# Patient Record
Sex: Male | Born: 1965 | Race: White | Hispanic: No | Marital: Married | Smoking: Never smoker
Health system: Southern US, Community
[De-identification: ages and names within clinical notes are randomized; demographics above are authoritative.]

## PROBLEM LIST (undated history)

## (undated) DIAGNOSIS — G4730 Sleep apnea, unspecified: Secondary | ICD-10-CM

## (undated) DIAGNOSIS — I10 Essential (primary) hypertension: Secondary | ICD-10-CM

## (undated) DIAGNOSIS — E785 Hyperlipidemia, unspecified: Secondary | ICD-10-CM

## (undated) DIAGNOSIS — M549 Dorsalgia, unspecified: Secondary | ICD-10-CM

## (undated) DIAGNOSIS — I219 Acute myocardial infarction, unspecified: Secondary | ICD-10-CM

## (undated) HISTORY — DX: Sleep apnea, unspecified: G47.30

## (undated) HISTORY — DX: Morbid (severe) obesity due to excess calories: E66.01

## (undated) HISTORY — PX: CARDIAC CATHETERIZATION: SHX172

## (undated) HISTORY — DX: Acute myocardial infarction, unspecified: I21.9

## (undated) HISTORY — DX: Essential (primary) hypertension: I10

## (undated) HISTORY — DX: Dorsalgia, unspecified: M54.9

## (undated) HISTORY — DX: Hyperlipidemia, unspecified: E78.5

---

## 1999-06-01 ENCOUNTER — Emergency Department (HOSPITAL_COMMUNITY): Admission: EM | Admit: 1999-06-01 | Discharge: 1999-06-01 | Payer: Self-pay | Admitting: Emergency Medicine

## 2000-07-27 ENCOUNTER — Encounter: Admission: RE | Admit: 2000-07-27 | Discharge: 2000-08-11 | Payer: Self-pay | Admitting: Occupational Medicine

## 2009-05-23 ENCOUNTER — Ambulatory Visit (HOSPITAL_COMMUNITY): Admission: RE | Admit: 2009-05-23 | Discharge: 2009-05-23 | Payer: Self-pay | Admitting: Surgery

## 2009-05-28 ENCOUNTER — Ambulatory Visit (HOSPITAL_COMMUNITY): Admission: RE | Admit: 2009-05-28 | Discharge: 2009-05-28 | Payer: Self-pay | Admitting: Surgery

## 2009-06-13 ENCOUNTER — Encounter: Admission: RE | Admit: 2009-06-13 | Discharge: 2009-06-13 | Payer: Self-pay | Admitting: Surgery

## 2010-05-02 ENCOUNTER — Encounter: Admission: RE | Admit: 2010-05-02 | Discharge: 2010-07-31 | Payer: Self-pay | Admitting: Surgery

## 2010-05-20 IMAGING — RF DG UGI W/O KUB
7 series · 7 of 7 positions shown · non-contrast
Comparison: None

CLINICAL DATA: Bariatric screening.

UPPER GI SERIES WITHOUT KUB
TECHNIQUE: Routine upper GI series was performed with thin barium.
Fluoroscopy Time: 1.1 minutes

[Series 1: run · 1 of 1 slices shown (1 of 7)]
[im 1/1]
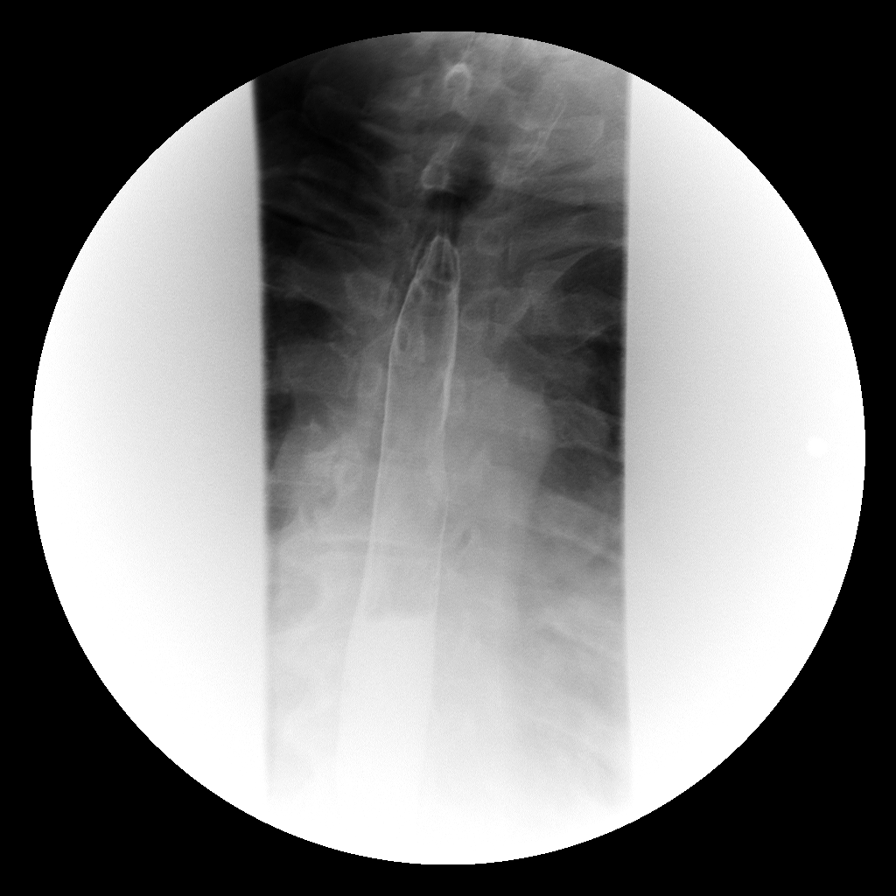

[Series 2: run · 1 of 1 slices shown (2 of 7)]
[im 1/1]
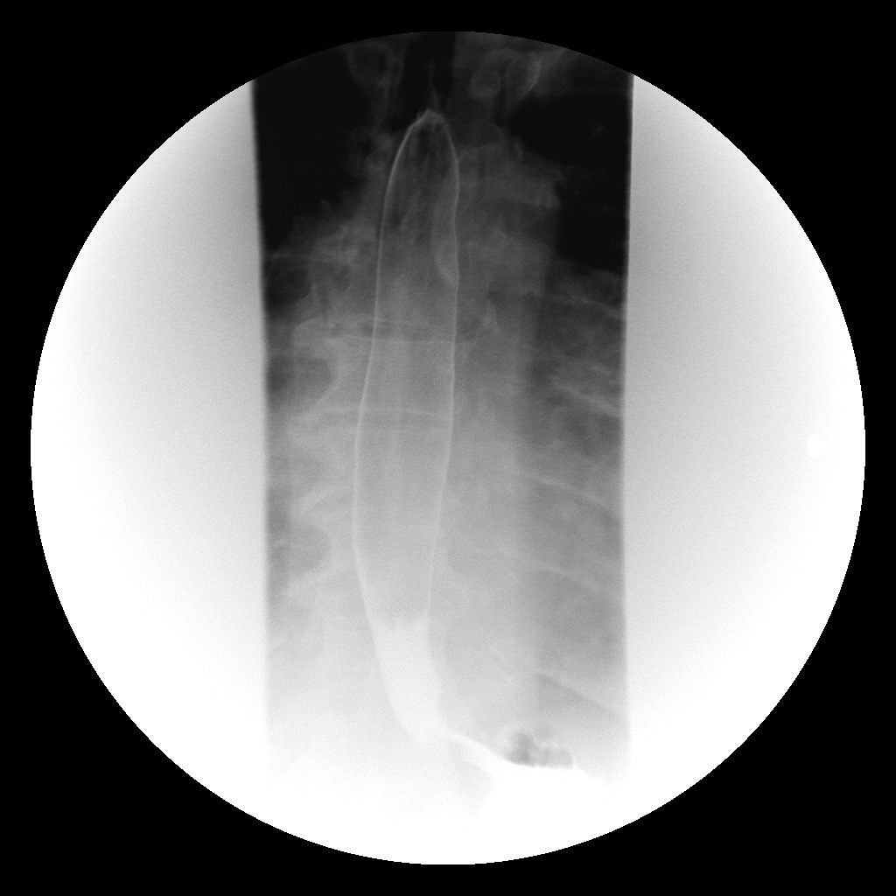

[Series 3: run · 1 of 1 slices shown (3 of 7)]
[im 1/1]
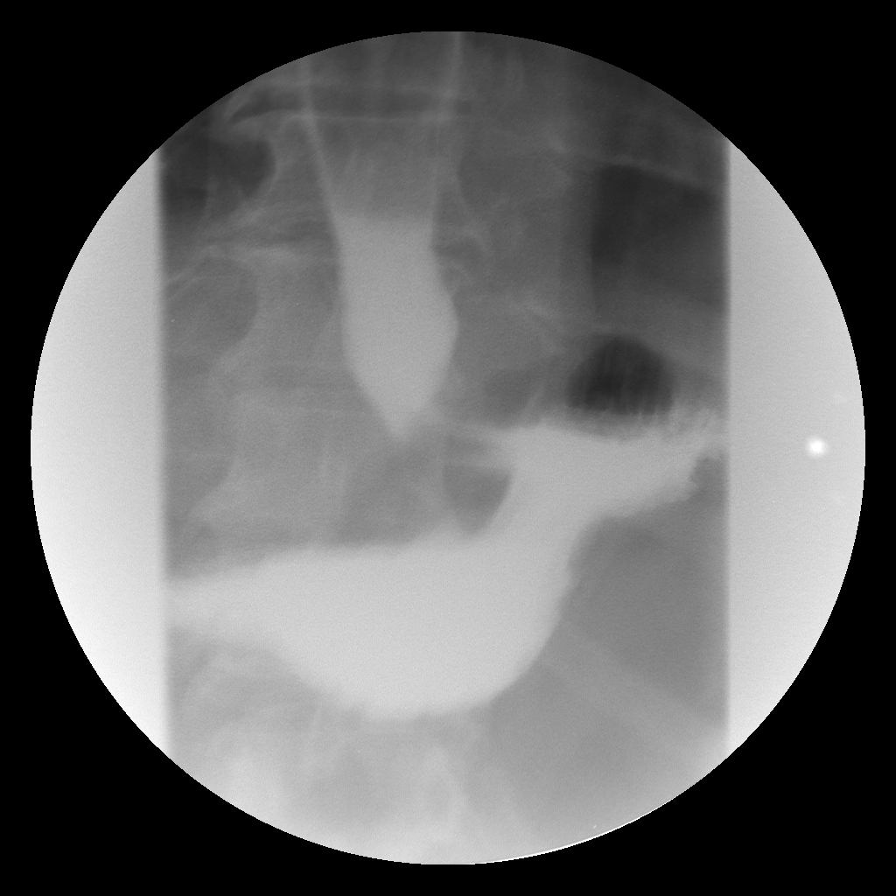

[Series 4: run · 1 of 1 slices shown (4 of 7)]
[im 1/1]
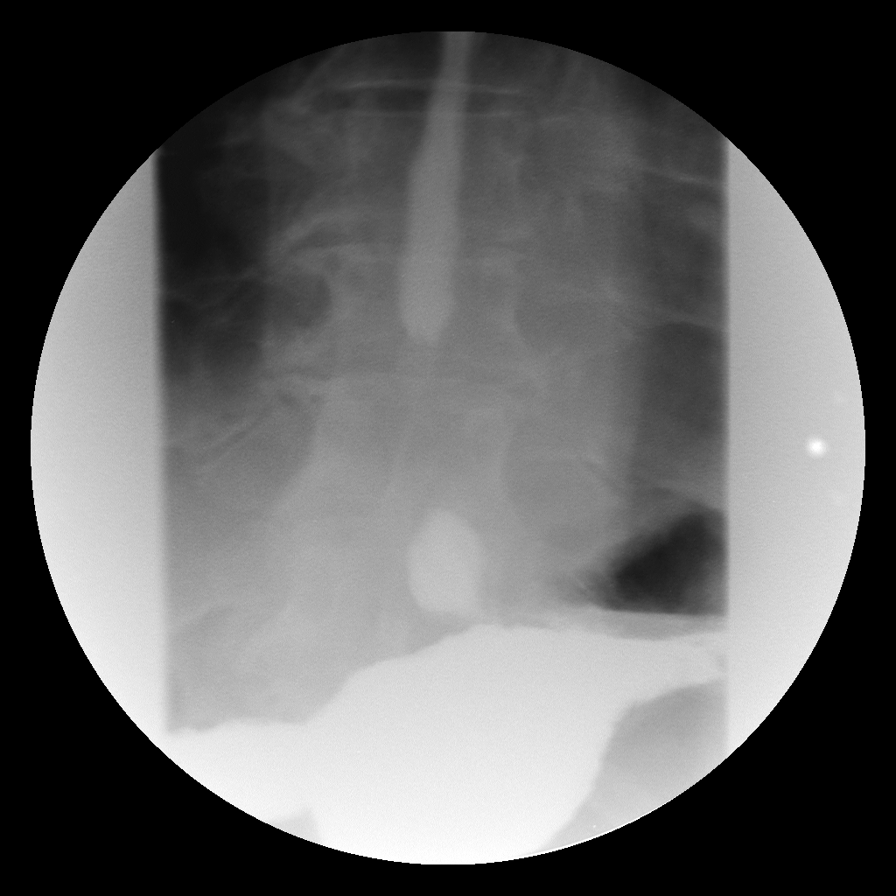

[Series 5: run · 1 of 1 slices shown (5 of 7)]
[im 1/1]
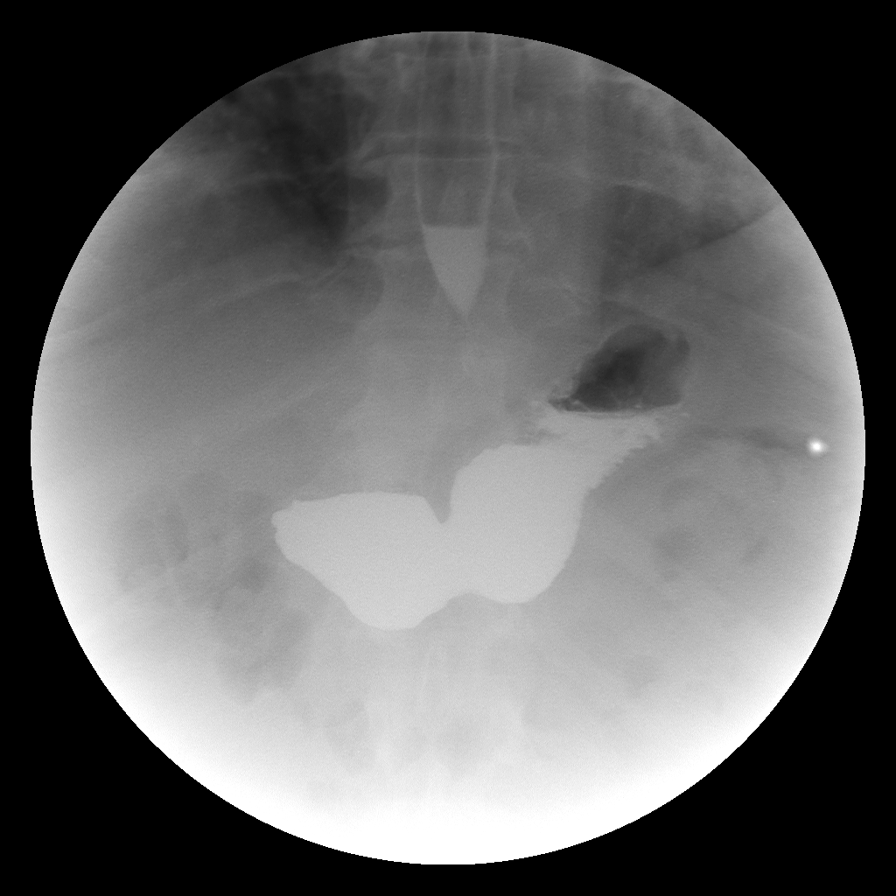

[Series 6: run · 1 of 1 slices shown (6 of 7)]
[im 1/1]
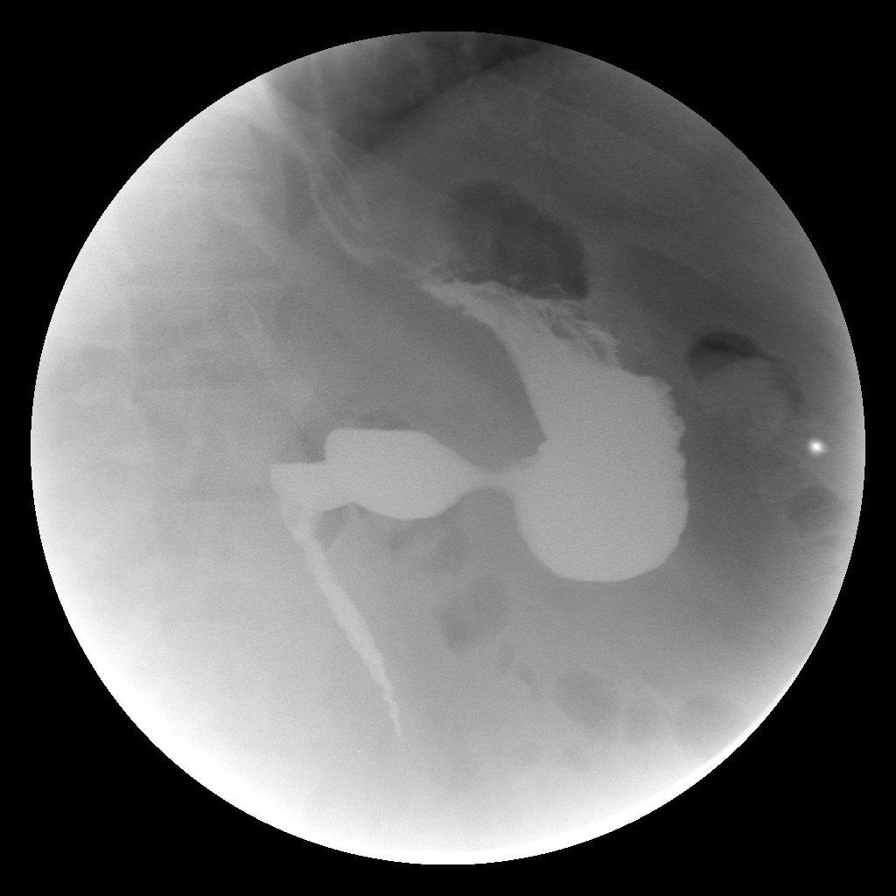

[Series 7: run · 1 of 1 slices shown (7 of 7)]
[im 1/1]
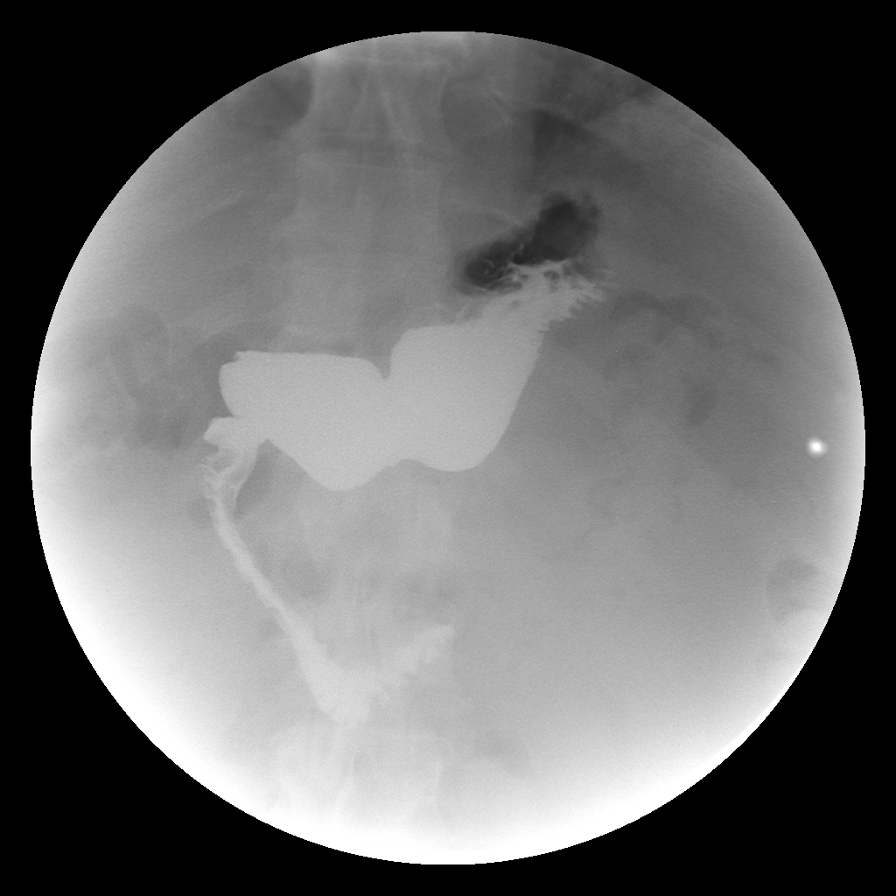

[7 of 7 positions shown; findings below may reference images not displayed]

FINDINGS: A limited upper GI was performed in the standing
position, due to body habitus.  Esophagus, stomach and duodenal C-
loop are unremarkable.
IMPRESSION: Limited exam performed prior to bariatric screening.  No acute
findings.

## 2010-05-21 ENCOUNTER — Ambulatory Visit (HOSPITAL_COMMUNITY): Admission: RE | Admit: 2010-05-21 | Discharge: 2010-05-22 | Payer: Self-pay | Admitting: Surgery

## 2010-05-21 HISTORY — PX: LAPAROSCOPIC GASTRIC BANDING: SHX1100

## 2010-08-01 ENCOUNTER — Encounter
Admission: RE | Admit: 2010-08-01 | Discharge: 2010-09-12 | Payer: Self-pay | Source: Home / Self Care | Admitting: Surgery

## 2010-10-02 ENCOUNTER — Encounter
Admission: RE | Admit: 2010-10-02 | Discharge: 2010-10-02 | Payer: Self-pay | Source: Home / Self Care | Attending: Surgery | Admitting: Surgery

## 2011-01-01 ENCOUNTER — Encounter
Admission: RE | Admit: 2011-01-01 | Discharge: 2011-01-14 | Payer: Self-pay | Source: Home / Self Care | Attending: Surgery | Admitting: Surgery

## 2011-03-03 LAB — COMPREHENSIVE METABOLIC PANEL
Albumin: 4.3 g/dL (ref 3.5–5.2)
CO2: 29 mEq/L (ref 19–32)
Calcium: 9.3 mg/dL (ref 8.4–10.5)
GFR calc non Af Amer: >60 mL/min (ref >60)
Glucose, Bld: 85 mg/dL (ref 70–99)
Potassium: 4.9 mEq/L (ref 3.5–5.1)
Sodium: 136 mEq/L (ref 135–145)
Total Protein: 7.1 g/dL (ref 6.0–8.3)

## 2011-03-03 LAB — DIFFERENTIAL
Basophils Absolute: 0 10*3/uL (ref 0.0–0.1)
Eosinophils Absolute: 0 10*3/uL (ref 0.0–0.7)
Eosinophils Absolute: 0 10*3/uL (ref 0.0–0.7)
Eosinophils Relative: 0 % (ref 0–5)
Lymphocytes Relative: 23 % (ref 12–46)
Lymphs Abs: 1.6 10*3/uL (ref 0.7–4.0)
Lymphs Abs: 1.7 10*3/uL (ref 0.7–4.0)
Monocytes Absolute: 0.6 10*3/uL (ref 0.1–1.0)
Monocytes Absolute: 0.9 10*3/uL (ref 0.1–1.0)
Monocytes Relative: 9 % (ref 3–12)
Neutro Abs: 9.6 10*3/uL — ABNORMAL HIGH (ref 1.7–7.7)
Neutrophils Relative %: 67 % (ref 43–77)

## 2011-03-03 LAB — HEMOGLOBIN AND HEMATOCRIT, BLOOD
HCT: 46.4 % (ref 39.0–52.0)
Hemoglobin: 15.6 g/dL (ref 13.0–17.0)

## 2011-03-03 LAB — CBC
Hemoglobin: 14.8 g/dL (ref 13.0–17.0)
Hemoglobin: 15.9 g/dL (ref 13.0–17.0)
MCHC: 33.9 g/dL (ref 30.0–36.0)
MCHC: 33.9 g/dL (ref 30.0–36.0)
Platelets: 227 10*3/uL (ref 150–400)

## 2011-03-03 LAB — CARDIAC PANEL(CRET KIN+CKTOT+MB+TROPI)
CK, MB: 3.6 ng/mL (ref 0.3–4.0)
Relative Index: 1.2 (ref 0.0–2.5)
Total CK: 308 U/L — ABNORMAL HIGH (ref 7–232)

## 2011-05-18 IMAGING — CR DG CHEST 1V PORT
1 series · 1 of 1 positions shown · non-contrast
Comparison: Chest x-ray of 05/23/2009

CLINICAL DATA: Status post lap banding, chest pain

PORTABLE CHEST - 1 VIEW

[view not recorded]
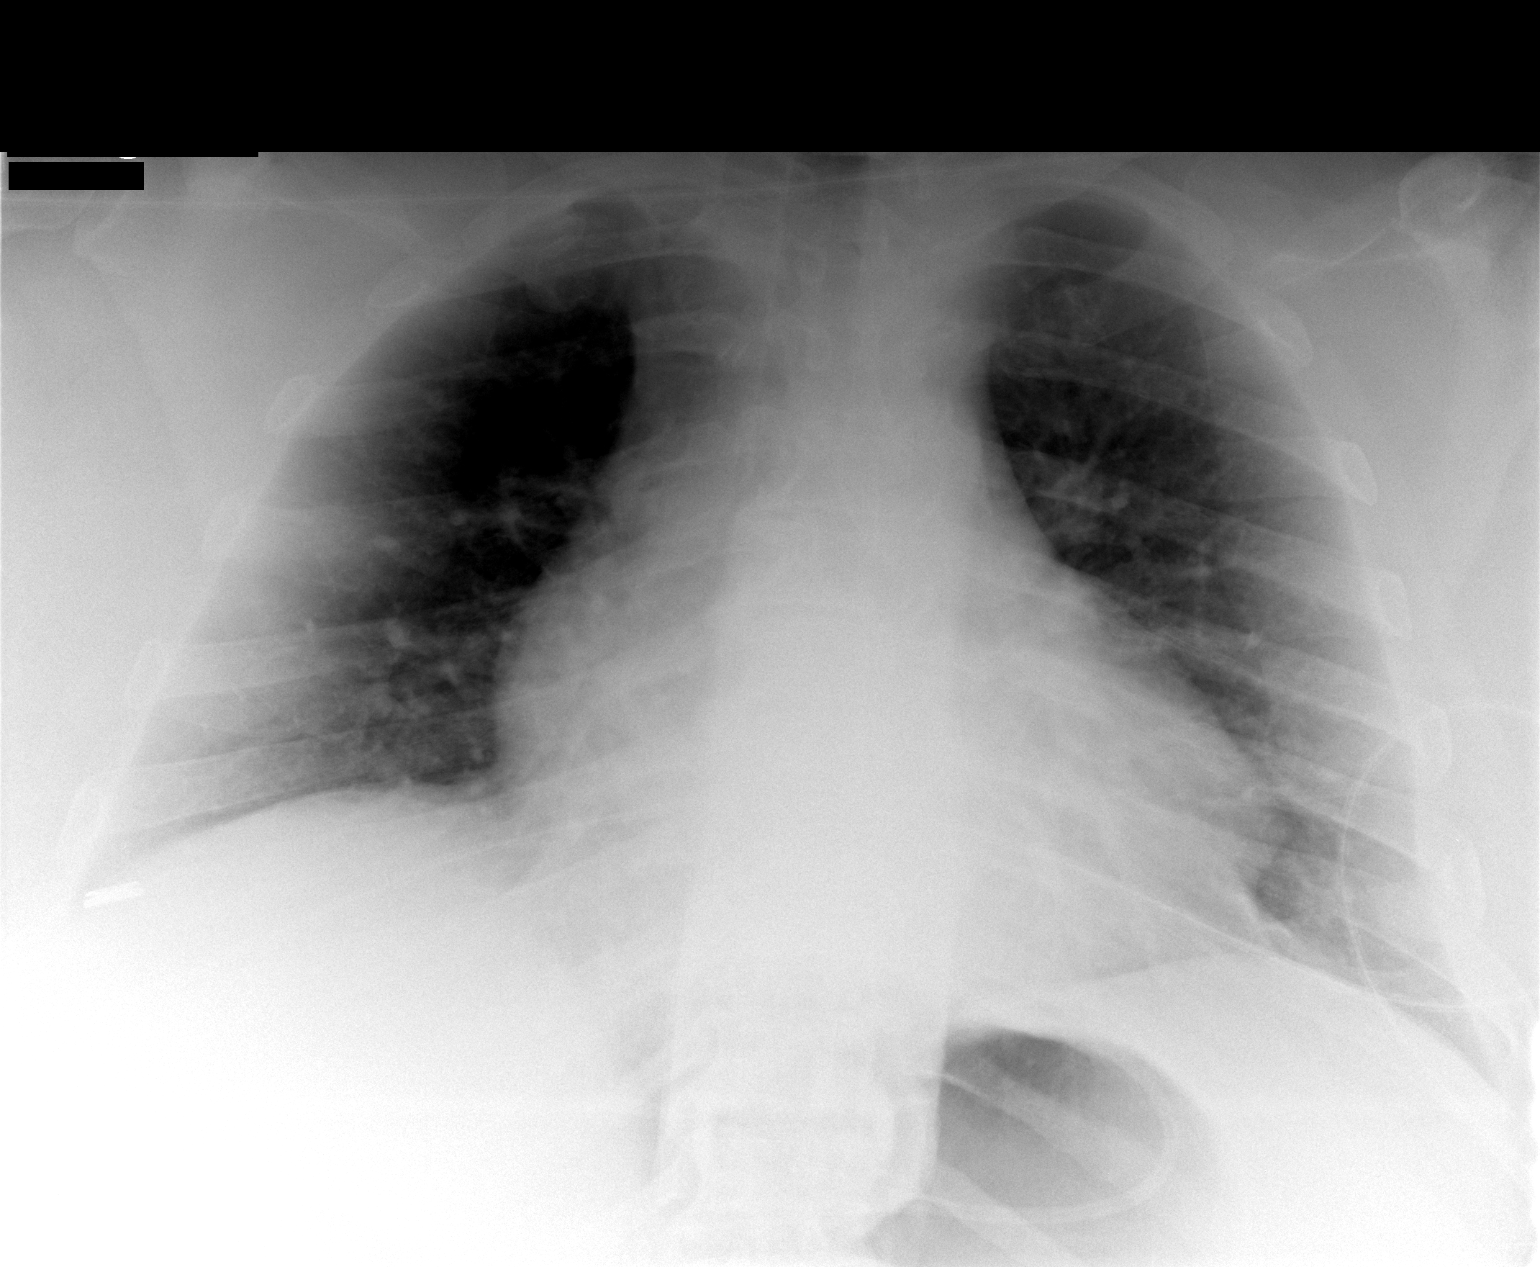

[1 of 1 positions shown; findings below may reference images not displayed]

FINDINGS: The lungs are clear.  Cardiomegaly is stable.  Lap band
is faintly visualized just below the region of the gastroesophageal
junction and there is some gas within the gastric fundus.
IMPRESSION: No active lung disease.  Stable cardiomegaly.

## 2011-05-19 IMAGING — CR DG UGI W/ GASTROGRAFIN
2 series · 2 of 2 positions shown · IV contrast (agent unspecified)
Comparison: 05/23/2009

CLINICAL DATA: Morbid obesity.  Gastric banding.

WATER SOLUBLE UPPER GI SERIES
TECHNIQUE: Single-column upper GI series was performed using water
soluble contrast.
Fluoroscopy Time: 0.7-minute
Contrast: 20 ml Wmnipaque-SKK orally.

[view not recorded (1 of 2)]
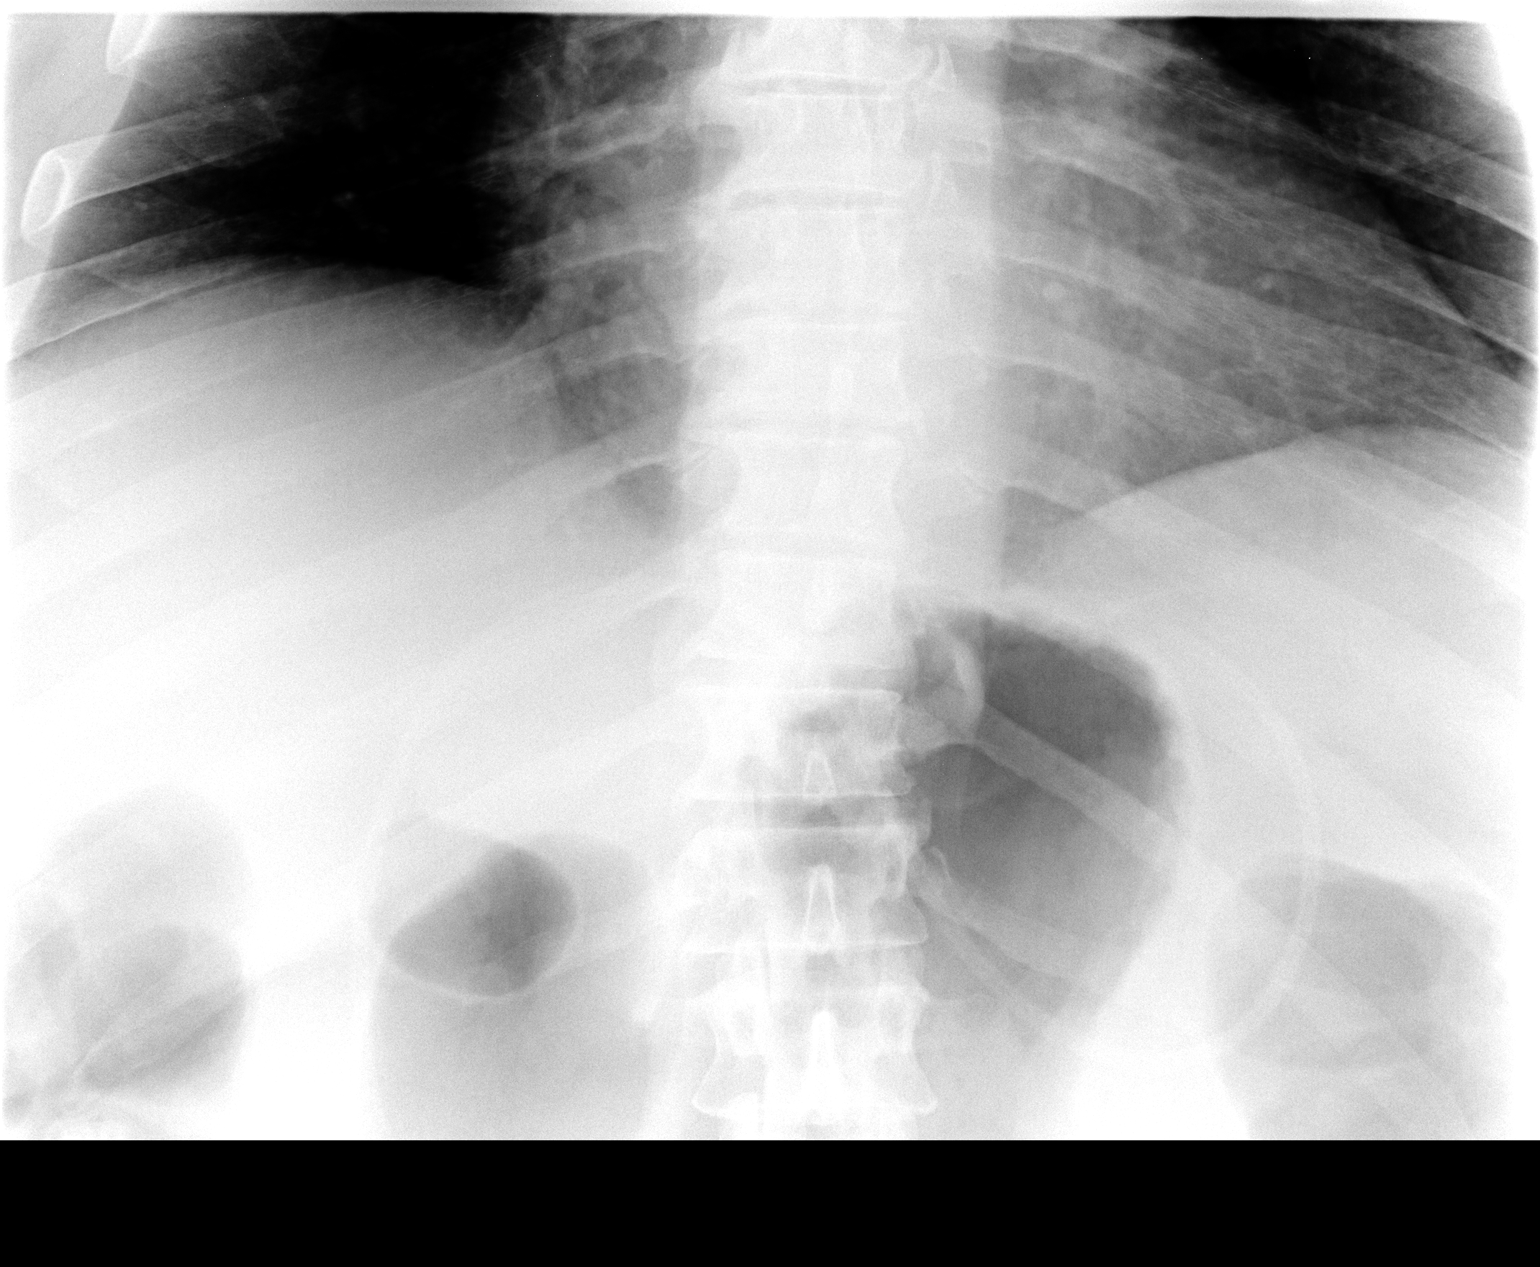

[view not recorded (2 of 2)]
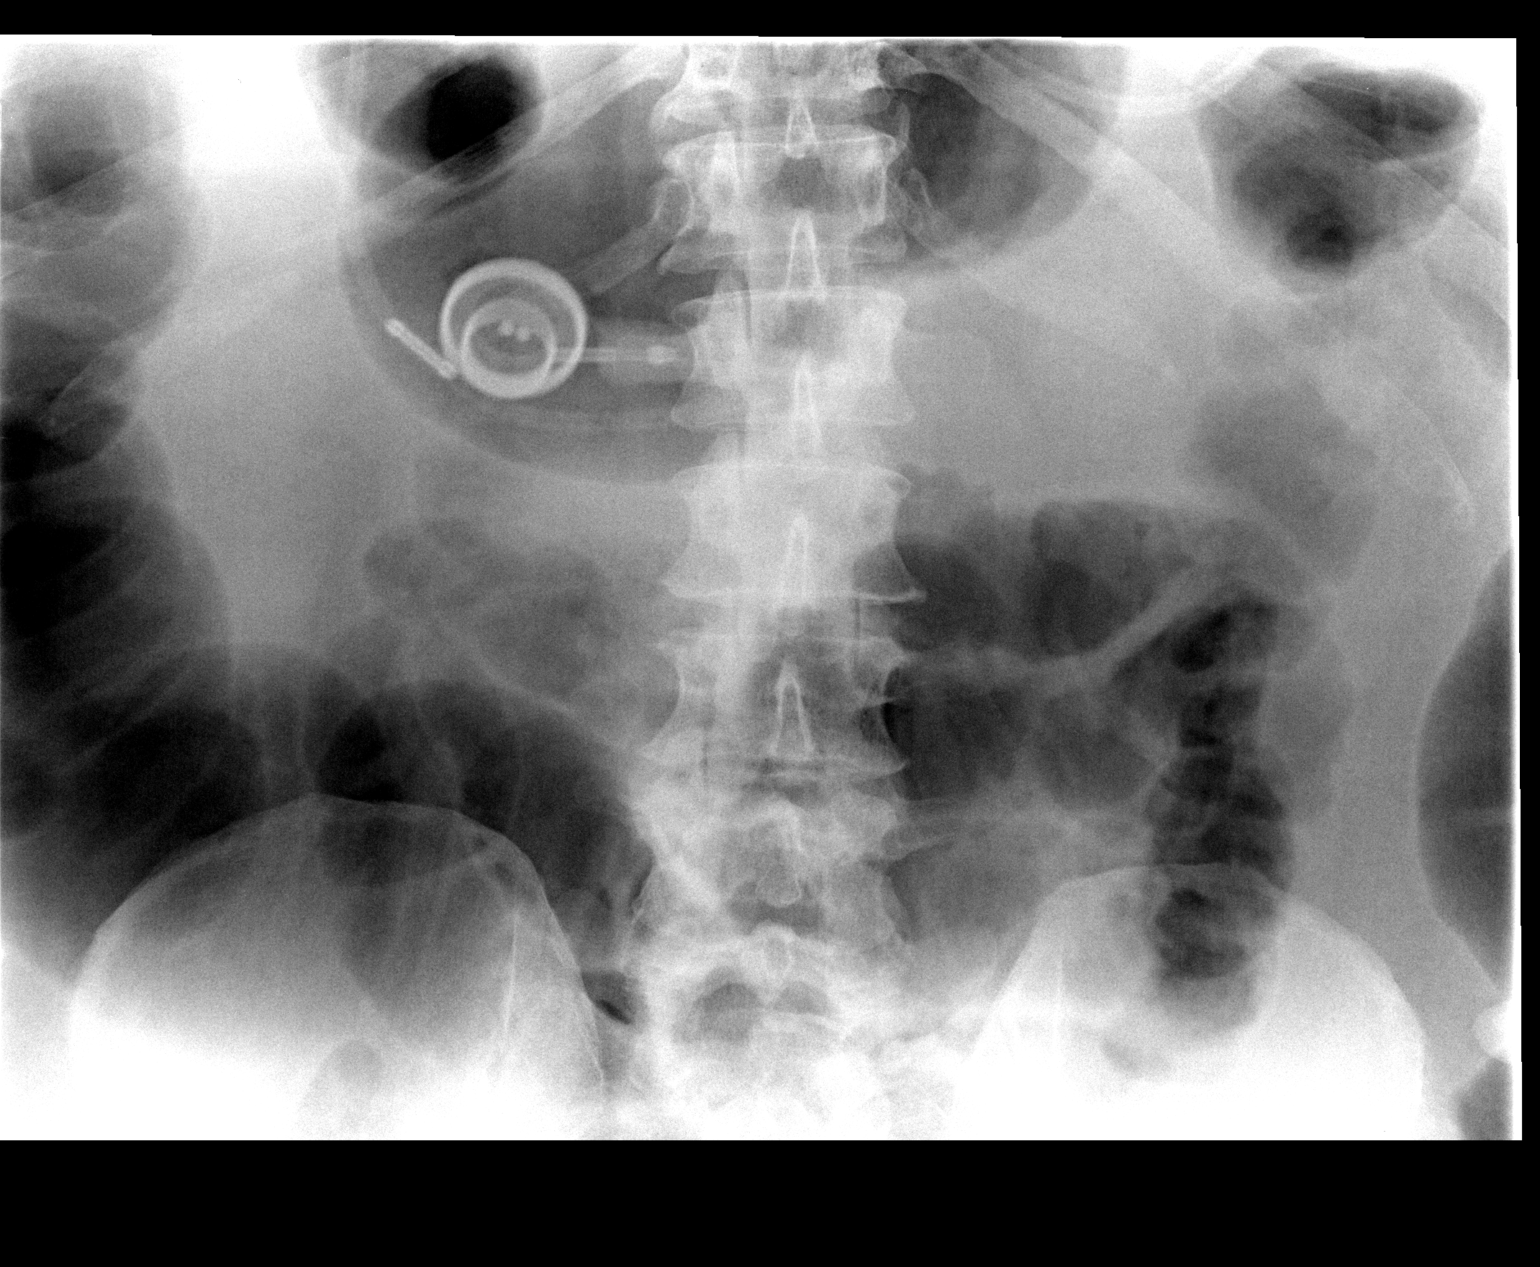

[2 of 2 positions shown; findings below may reference images not displayed]

FINDINGS: The study is limited by morbid obesity.

Preliminary image reveals a gastric band in the 8 o'clock 2 o'clock
position.  Tubing is difficult to see however appears to be
continuous.  The port is in the right mid abdomen.  There is a mild
ileus with gas in the colon.

The patient swallowed Omnipaque contrast.  The patient was
standing for the study.  Contrast readily enters the stomach
without obstruction due to the gastric band.  Gastric band appears
to be in the appropriate position.  The stomach is evaluated in a
limited fashion due to 20 ml of contrast used.  The stomach empties
into the duodenum.

No evidence of leak or obstruction.
IMPRESSION: Satisfactory gastric banding without obstruction or leak.

I called the report to [place] , Dr. Yohisa nurse, on 05/22/2010 at
6333 hours.

## 2011-08-07 ENCOUNTER — Encounter: Payer: Managed Care, Other (non HMO) | Attending: Surgery | Admitting: *Deleted

## 2011-08-07 ENCOUNTER — Encounter: Payer: Self-pay | Admitting: *Deleted

## 2011-08-07 DIAGNOSIS — Z9884 Bariatric surgery status: Secondary | ICD-10-CM | POA: Insufficient documentation

## 2011-08-07 DIAGNOSIS — Z713 Dietary counseling and surveillance: Secondary | ICD-10-CM | POA: Insufficient documentation

## 2011-08-07 DIAGNOSIS — Z09 Encounter for follow-up examination after completed treatment for conditions other than malignant neoplasm: Secondary | ICD-10-CM | POA: Insufficient documentation

## 2011-08-07 NOTE — Patient Instructions (Signed)
Goals:  Follow Phase 3B: High Protein + Non-Starchy Vegetables  Eat 3-6 small meals/snacks, every 3-5 hrs  Increase lean protein foods to meet 80-100g goal  Increase fluid intake to 64oz +  Add 15 grams of carbohydrate (fruit, whole grain, starchy vegetable) with meals  Avoid drinking 15 minutes before, during and 30 minutes after eating  Aim for >30 min of physical activity daily 

## 2011-08-07 NOTE — Progress Notes (Signed)
  Follow-up visit: 14 Months Post-Operative LAGB Surgery  Medical Nutrition Therapy:  Appt start time: 1130 end time:  1200.  Assessment:  Primary concerns today: post-operative bariatric surgery nutrition management. Pt reports today that he has been trying to make an appointment for a LAGB fill with Dr. Daphine Deutscher since June. He states that each time he calls [facility] he is put on a call list to see him. He would rather see Dr. Daphine Deutscher over the PA. He feels he is in a desperate need for a fill as portions are becoming larger and weight loss has slowed.  Weight today: 296.2 lbs Weight change: 3.9 lbs since 12/2010 Total weight lost: 129.3 lbs (133.8 lbs total on pt's scales) BMI: 40.3% Weight goal: 225-250 lbs % Weight goal met: 70%  24-hr recall:  B (8-9 AM):1 cup Bran cereal w/ protein drink (uses this as milk)  L (11 AM-3 PM): [employer] Grilled chicken (4 pc) Snk (4 PM): Protein shake  D (8 PM-1 AM): 2-egg Fiesta omelet (egg, veggies), 2 pc toast, 1 cup grits Snk (Variable times): Protein shake or Cereal bar *Pt reports consuming 3 protein shakes per day  Fluid intake: water, protein shakes = >64 oz Estimated total protein intake: 100g+ protein  Medications: Pt reports that he has been taken off so many things he can't remember his meds Supplementation: Taking 50% of the time  Using straws: No Drinking while eating: No Hair loss: No Carbonated beverages: No N/V/D/C: Constipation, uses bran cereal for this Last Lap-Band fill: No recent fill; pt is very upset about this. 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  Handouts given during visit include:  Food label reading guideline handout   Nutritional Diagnosis:  [place]-3.3 Overweight/obesity As related to recent bariatric surgery.  As  evidenced by attempt at continued weight loss through following bariatric surgery dietary guidelines.    Intervention:    Follow Phase 3B: High Protein + Non-Starchy Vegetables  Eat 3-6 small meals/snacks, every 3-5 hrs  Increase lean protein foods to meet 80-100g goal  Increase fluid intake to 64oz +  Add 15-20 grams of carbohydrate (fruit, whole grain, starchy vegetable) with meals  Avoid drinking 15 minutes before, during and 30 minutes after eating  Aim for >30 min of physical activity daily  Monitoring/Evaluation:  Dietary intake, exercise, lap band fills, and body weight. Follow up PRN.

## 2011-08-26 ENCOUNTER — Encounter (INDEPENDENT_AMBULATORY_CARE_PROVIDER_SITE_OTHER): Payer: Self-pay | Admitting: General Surgery

## 2011-08-27 ENCOUNTER — Encounter (INDEPENDENT_AMBULATORY_CARE_PROVIDER_SITE_OTHER): Payer: Self-pay | Admitting: Surgery

## 2011-08-27 ENCOUNTER — Ambulatory Visit (INDEPENDENT_AMBULATORY_CARE_PROVIDER_SITE_OTHER): Payer: Managed Care, Other (non HMO) | Admitting: Surgery

## 2011-08-27 DIAGNOSIS — Z9884 Bariatric surgery status: Secondary | ICD-10-CM

## 2011-08-27 DIAGNOSIS — Z4651 Encounter for fitting and adjustment of gastric lap band: Secondary | ICD-10-CM

## 2011-08-27 NOTE — Patient Instructions (Signed)

## 2011-08-27 NOTE — Progress Notes (Signed)
Mr. Dolata is down to 44 and has lost 136 pounds since his LAP-BAND APL system.  He is 15 months postop and has lost 137 pounds. This is 51% excess weight loss and his BMI today is down to 41. His preop BMI was 60. We still have a ways to go and I went ahead and added fluid has been today. His comorbidities included fatigue, heart disease, high blood pressure, joint pain, arthritis, high cholesterol. His primary care physician is Dr. Morton Stall.    Today I added one half cc to his band. If this suffices I can probably see him 6 weeks to 3 months out. However I've had him make an appointment for 3 weeks so that if he's not as tight as we wanted to be and we can add more. I would include that as part of today's chart and not charge him anymore further adjustments to get him as firmly in the "Green Zone".

## 2011-10-01 ENCOUNTER — Encounter (INDEPENDENT_AMBULATORY_CARE_PROVIDER_SITE_OTHER): Payer: Self-pay | Admitting: Surgery

## 2011-10-01 ENCOUNTER — Ambulatory Visit (INDEPENDENT_AMBULATORY_CARE_PROVIDER_SITE_OTHER): Payer: Managed Care, Other (non HMO) | Admitting: Surgery

## 2011-10-01 VITALS — BP 124/78 | HR 68 | Temp 98.4°F | Resp 16 | Ht 71.0 in | Wt 300.2 lb

## 2011-10-01 DIAGNOSIS — Z9884 Bariatric surgery status: Secondary | ICD-10-CM

## 2011-10-01 DIAGNOSIS — Z4651 Encounter for fitting and adjustment of gastric lap band: Secondary | ICD-10-CM

## 2011-10-01 NOTE — Progress Notes (Signed)
Justin Marquez comes in today for a band fill. His weight has plateaued in between 290 and 300 pounds. He started out of 433. I went ahead and added 0.6 cc to his band today. He can drink fluids fine. I will see him back in 6 weeks.

## 2011-11-21 ENCOUNTER — Ambulatory Visit (INDEPENDENT_AMBULATORY_CARE_PROVIDER_SITE_OTHER): Payer: Managed Care, Other (non HMO) | Admitting: Surgery

## 2011-11-21 ENCOUNTER — Encounter (INDEPENDENT_AMBULATORY_CARE_PROVIDER_SITE_OTHER): Payer: Self-pay | Admitting: Surgery

## 2011-11-21 VITALS — BP 132/88 | HR 68 | Temp 97.1°F | Resp 18 | Ht 71.0 in | Wt 304.1 lb

## 2011-11-21 DIAGNOSIS — Z9884 Bariatric surgery status: Secondary | ICD-10-CM

## 2011-11-21 DIAGNOSIS — Z4651 Encounter for fitting and adjustment of gastric lap band: Secondary | ICD-10-CM

## 2011-11-21 NOTE — Progress Notes (Signed)
Justin Marquez comes in today after spending most and not an option. He obtained a low white but not that much but he said Thanksgiving was really tough for carb consumption.  Today I added 0.5 cc was band. I warned him about eating too many carbs and we'll see him back in 2 months.

## 2011-11-21 NOTE — Patient Instructions (Signed)

## 2012-01-30 ENCOUNTER — Ambulatory Visit (INDEPENDENT_AMBULATORY_CARE_PROVIDER_SITE_OTHER): Payer: Managed Care, Other (non HMO) | Admitting: Surgery

## 2012-01-30 ENCOUNTER — Encounter (INDEPENDENT_AMBULATORY_CARE_PROVIDER_SITE_OTHER): Payer: Self-pay | Admitting: Surgery

## 2012-01-30 DIAGNOSIS — Z9884 Bariatric surgery status: Secondary | ICD-10-CM | POA: Insufficient documentation

## 2012-01-30 DIAGNOSIS — E669 Obesity, unspecified: Secondary | ICD-10-CM | POA: Insufficient documentation

## 2012-01-30 NOTE — Progress Notes (Signed)
Mr. Justin Marquez is having some nasal congestion that he says is not related to allergies but it is interfered with his using his CPAP machine. His weight is down to 300.6 but he said he really hasn't been able to exercise or work at weight loss. He feels he is optimally restricted at this time. Therefore I did not do a fill on him. We will instead see him back in 2 months.

## 2012-03-26 ENCOUNTER — Encounter (INDEPENDENT_AMBULATORY_CARE_PROVIDER_SITE_OTHER): Payer: Managed Care, Other (non HMO) | Admitting: Surgery

## 2013-01-26 ENCOUNTER — Encounter: Payer: Managed Care, Other (non HMO) | Attending: Surgery | Admitting: *Deleted

## 2013-01-26 ENCOUNTER — Encounter: Payer: Self-pay | Admitting: *Deleted

## 2013-01-26 ENCOUNTER — Ambulatory Visit (INDEPENDENT_AMBULATORY_CARE_PROVIDER_SITE_OTHER): Payer: Managed Care, Other (non HMO) | Admitting: Surgery

## 2013-01-26 ENCOUNTER — Encounter (INDEPENDENT_AMBULATORY_CARE_PROVIDER_SITE_OTHER): Payer: Managed Care, Other (non HMO) | Admitting: Surgery

## 2013-01-26 ENCOUNTER — Encounter (INDEPENDENT_AMBULATORY_CARE_PROVIDER_SITE_OTHER): Payer: Self-pay | Admitting: Surgery

## 2013-01-26 VITALS — BP 152/76 | HR 64 | Temp 97.6°F | Resp 12 | Ht 71.0 in | Wt 305.8 lb

## 2013-01-26 VITALS — Ht 72.0 in | Wt 304.5 lb

## 2013-01-26 DIAGNOSIS — Z9884 Bariatric surgery status: Secondary | ICD-10-CM

## 2013-01-26 DIAGNOSIS — Z09 Encounter for follow-up examination after completed treatment for conditions other than malignant neoplasm: Secondary | ICD-10-CM | POA: Insufficient documentation

## 2013-01-26 DIAGNOSIS — E669 Obesity, unspecified: Secondary | ICD-10-CM

## 2013-01-26 DIAGNOSIS — Z713 Dietary counseling and surveillance: Secondary | ICD-10-CM | POA: Insufficient documentation

## 2013-01-26 NOTE — Patient Instructions (Signed)

## 2013-01-26 NOTE — Patient Instructions (Addendum)
Goals:  Follow Bariatric Surgery Specialized Post-Op Diet  Limit carbohydrate intake to 15-20 grams per meal and 15 grams per snack  Measure portions and utilize www.calorieking.com for food label information   Avoid drinking 15 minutes before, during and 30 minutes after eating  Aim for >30 min of physical activity daily  It may help to track your intake for a few weeks to identify problem areas  Start weight: 411.5 lbs (06/13/09) Weight today: 304.5 lbs  Total weight lost: 107.0 lbs  Goal weight: 225-250 lbs  % goal met: 57-66%   TANITA BODY COMP RESULTS   01/26/13   BMI (kg/m^2)  41.3   Fat Mass (lbs)  106.5   Fat Free Mass (lbs)  198.0   Total Body Water (lbs)  45.0    Food Recall Analysis (approximate):  170g CHO 110g Protein 75g Fat

## 2013-01-26 NOTE — Progress Notes (Signed)
Mauricio Dahlen Wilden Body mass index is 42.67 kg/(m^2).  Having regurgitation:  no  Nocturnal reflux?  no  Amount of fill  0.5  Wants to get back on track and just saw the nutritionist.  Will see back in 3 months if he needs to be seen.

## 2013-01-26 NOTE — Progress Notes (Signed)
  Follow-up visit:  2.5 Years Post-Operative LAGB Surgery  Medical Nutrition Therapy:  Appt start time:  1130   End time: 1230.  Primary concerns today: Post-operative Bariatric Surgery Nutrition Management. "Justin Marquez" comes today to re-establish care after a 1.5 yr hiatus from [facility]. Reports lowest post-op wt of 289 lbs. Has maintained current wt of 304 lbs for last 4-6 mos. Food recall reveals highly excessive CHO intake when traveling with his job and at home. States he is trying to make better choices when eating out. Discussed reading food labels and using calorieking.com for CHO amounts. Has not had f/u with Dr. Daphine Deutscher since 01/2012. Sees him today for possible fill. No structured exercise noted. 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    Surgery date: 05/21/10  Surgery type: LAGB  Start weight at [facility]: 411.5 (06/13/09) Lowest pt reported post-op wt: 289.0 lbs   Weight today: 304.5 lbs  Weight change: 15.5 lbs (GAIN) Total weight lost: 107.0 lbs   Goal weight: 225-250 lbs  % goal met: 57-66%  TANITA  BODY COMP RESULTS  01/26/13   BMI (kg/m^2) 41.3   Fat Mass (lbs) 106.5   Fat Free Mass (lbs) 198.0   Total Body Water (lbs) 45.0   24-hr recall: B (AM): 2.5 cups bran cereal (80g CHO) w/ Atkins shake and skim milk ("to stay regular") Snk (AM): Apple (med) or 2 oz nuts (peanuts, almonds)  L (PM): Lemon pepper chicken legs, broiled (2-3 ea) OR 1/2 bratwurst and Atkins shake (waits 5-10 min after meal before drinking) Snk (PM): Beef jerky, nuts, apple, or pepperoni  D (PM): English muffin w/ egg whites, ham, spinach, tomato, [place] bacon; 1/2 cup vanilla yogurt w/ blueberries; 1/2 cup fruit (bananas&grapes) Snk (PM): Atkins shake  Food Recall Analysis (approximate):  170g CHO 110g Protein 75g Fat  Fluid intake: 33 oz shakes; 2-3 (17 oz) water bottles, diet green tea (17oz), reg coffee:  80-95 oz Estimated total protein intake: 100-110 g  Medications:  See medication list Supplementation: Unknown  Using straws: Rarely Drinking while eating: No, but does 10 min afterwards Hair loss: No Carbonated beverages: No N/V/D/C: Dry foods cause belching; cannot tolerate chicken breast Last Lap-Band fill:  11/21/11; Seeing Dr. Daphine Deutscher today for possible fill  Recent physical activity:  No structured exercise, though highly active at work.   Progress Towards Goal(s):  In progress.  Handouts given during visit include:  Bariatric Surgery Specialized Post-Op Diet  Low CHO Snack List  Meal Planning Card   Nutritional Diagnosis:  [place]-3.3 Overweight/obesity and [place]-3.4 Unintentional weight gain As related to excessive CHO and fat intake.  As evidenced by 15 lb wt gain s/p LAGB surgery.    Intervention:  Nutrition education.  Monitoring/Evaluation:  Dietary intake, exercise, lap band fills, and body weight. Follow up in 6 weeks.

## 2013-02-17 ENCOUNTER — Encounter (INDEPENDENT_AMBULATORY_CARE_PROVIDER_SITE_OTHER): Payer: Managed Care, Other (non HMO)

## 2013-03-09 ENCOUNTER — Encounter: Payer: Self-pay | Admitting: *Deleted

## 2013-03-09 ENCOUNTER — Encounter: Payer: Managed Care, Other (non HMO) | Attending: Surgery | Admitting: *Deleted

## 2013-03-09 VITALS — Ht 71.0 in | Wt 307.0 lb

## 2013-03-09 DIAGNOSIS — E669 Obesity, unspecified: Secondary | ICD-10-CM

## 2013-03-09 DIAGNOSIS — Z9884 Bariatric surgery status: Secondary | ICD-10-CM | POA: Insufficient documentation

## 2013-03-09 DIAGNOSIS — Z713 Dietary counseling and surveillance: Secondary | ICD-10-CM | POA: Insufficient documentation

## 2013-03-09 DIAGNOSIS — Z09 Encounter for follow-up examination after completed treatment for conditions other than malignant neoplasm: Secondary | ICD-10-CM | POA: Insufficient documentation

## 2013-03-09 NOTE — Progress Notes (Signed)
  Follow-up visit:  2.5 Years Post-Operative LAGB Surgery  Medical Nutrition Therapy:  Appt start time:  1045   End time: 1115.  Primary concerns today: Post-operative Bariatric Surgery Nutrition Management. "Justin Marquez" comes today to re-establish care after a 1.5 yr hiatus from [facility]. Reports severe stress in family regarding situation with his daughter, which caused him to choose high fat/carb foods and subsequent wt gain. Discussed working through current issues and getting back on track.   Surgery date: 05/21/10  Surgery type: LAGB  Start weight at [facility]: 411.5 (06/13/09) Lowest pt reported post-op wt: 289.0 lbs   Weight today: 307.0 lbs  Weight change: 2.5 lbs (GAIN) Total weight lost: 104.5 lbs   Goal weight: 225-250 lbs  % goal met: 57-66%  TANITA  BODY COMP RESULTS  01/26/13 03/09/13   BMI (kg/m^2) 41.3 42.8   Fat Mass (lbs) 106.5 113.0   Fat Free Mass (lbs) 198.0 194.0   Total Body Water (lbs) 145.0 142.0   Fluid intake: 33 oz shakes; 2-3 (17 oz) water bottles, diet green tea (17oz), reg coffee:  80-95 oz Estimated total protein intake: 80-90 g  Medications: See medication list Supplementation:  No  Using straws: Rarely Drinking while eating: No, but does 10 min afterwards Hair loss: No Carbonated beverages: No N/V/D/C: Dry foods cause belching; cannot tolerate chicken breast Last Lap-Band fill:  01/26/13  Recent physical activity:  No structured exercise, though highly active at work.   Progress Towards Goal(s):  In progress.  Nutritional Diagnosis:  [place]-3.3 Overweight/obesity and [place]-3.4 Unintentional weight gain related to excessive CHO and fat intake as evidenced by continued weight gain s/p LAGB surgery.    Intervention:  Nutrition education.  Monitoring/Evaluation:  Dietary intake, exercise, lap band fills, and body weight. Follow up in 6 weeks.

## 2013-03-09 NOTE — Patient Instructions (Signed)
Goals:  Follow Bariatric Surgery Specialized Post-Op Diet  Limit carbohydrate intake to 15-20 grams per meal and 15 grams per snack  Measure portions and utilize www.calorieking.com for food label information   Avoid drinking 15 minutes before, during and 30 minutes after eating  Aim for >30 min of physical activity daily  It may help to track your intake for a few weeks to identify problem areas

## 2013-03-11 ENCOUNTER — Encounter: Payer: Self-pay | Admitting: *Deleted

## 2013-04-28 ENCOUNTER — Encounter (INDEPENDENT_AMBULATORY_CARE_PROVIDER_SITE_OTHER): Payer: Managed Care, Other (non HMO) | Admitting: Surgery

## 2014-11-21 ENCOUNTER — Encounter (HOSPITAL_COMMUNITY): Payer: Self-pay | Admitting: *Deleted

## 2014-11-21 ENCOUNTER — Emergency Department (HOSPITAL_COMMUNITY)
Admission: EM | Admit: 2014-11-21 | Discharge: 2014-11-21 | Disposition: A | Discharge status: Home / Self Care | Payer: Managed Care, Other (non HMO) | Attending: Emergency Medicine | Admitting: Emergency Medicine

## 2014-11-21 DIAGNOSIS — S46111A Strain of muscle, fascia and tendon of long head of biceps, right arm, initial encounter: Secondary | ICD-10-CM | POA: Insufficient documentation

## 2014-11-21 DIAGNOSIS — Z791 Long term (current) use of non-steroidal anti-inflammatories (NSAID): Secondary | ICD-10-CM | POA: Diagnosis not present

## 2014-11-21 DIAGNOSIS — Y9389 Activity, other specified: Secondary | ICD-10-CM | POA: Diagnosis not present

## 2014-11-21 DIAGNOSIS — E780 Pure hypercholesterolemia: Secondary | ICD-10-CM | POA: Insufficient documentation

## 2014-11-21 DIAGNOSIS — Z79899 Other long term (current) drug therapy: Secondary | ICD-10-CM | POA: Diagnosis not present

## 2014-11-21 DIAGNOSIS — Z8669 Personal history of other diseases of the nervous system and sense organs: Secondary | ICD-10-CM | POA: Diagnosis not present

## 2014-11-21 DIAGNOSIS — Z9889 Other specified postprocedural states: Secondary | ICD-10-CM | POA: Diagnosis not present

## 2014-11-21 DIAGNOSIS — S4991XA Unspecified injury of right shoulder and upper arm, initial encounter: Secondary | ICD-10-CM | POA: Diagnosis present

## 2014-11-21 DIAGNOSIS — Z7982 Long term (current) use of aspirin: Secondary | ICD-10-CM | POA: Diagnosis not present

## 2014-11-21 DIAGNOSIS — Y9289 Other specified places as the place of occurrence of the external cause: Secondary | ICD-10-CM | POA: Diagnosis not present

## 2014-11-21 DIAGNOSIS — I10 Essential (primary) hypertension: Secondary | ICD-10-CM | POA: Diagnosis not present

## 2014-11-21 DIAGNOSIS — S46211A Strain of muscle, fascia and tendon of other parts of biceps, right arm, initial encounter: Secondary | ICD-10-CM

## 2014-11-21 DIAGNOSIS — W230XXA Caught, crushed, jammed, or pinched between moving objects, initial encounter: Secondary | ICD-10-CM | POA: Diagnosis not present

## 2014-11-21 DIAGNOSIS — Y998 Other external cause status: Secondary | ICD-10-CM | POA: Insufficient documentation

## 2014-11-21 MED ORDER — NAPROXEN 250 MG PO TABS
500.0000 mg | ORAL_TABLET | Freq: Once | ORAL | Status: AC
  Administered 2014-11-21: 500 mg via ORAL
  Filled 2014-11-21: qty 2

## 2014-11-21 MED ORDER — HYDROCODONE-ACETAMINOPHEN 5-325 MG PO TABS: 2.0000 | ORAL_TABLET | ORAL | Status: AC | PRN

## 2014-11-21 MED ORDER — NAPROXEN 500 MG PO TABS: 500.0000 mg | ORAL_TABLET | Freq: Two times a day (BID) | ORAL | Status: AC

## 2014-11-21 NOTE — ED Provider Notes (Signed)
CSN: [id number]     Arrival date & time 11/21/14  1825 History  This chart was scribed for Vida RollerBrian D Sherrel Ploch, MD by Gwenyth Oberatherine Macek, ED Scribe. This patient was seen in room APA10/APA10 and the patient's care was started at 7:44 PM.   Chief Complaint  Patient presents with  . Arm Injury   The history is provided by the patient. No language interpreter was used.    HPI Comments: Justin Marquez is a 48 y.o. 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 He states that he injured arm when he mistepped between truck and docking area, used his arm to hold onto the truck and heard a ripping sound. He notes decreased motion due to pain as an associated symptom. He denies any other injuries.  Sx were acute in onset,  Constant Worse with trying to straighten the arm No associated numbness / weakness  Past Medical History  Diagnosis Date  . Sleep apnea   . Hyperlipidemia   . Hypertension   . Back pain   . Heart attack   . Heart attack   . Morbid obesity    Past Surgical History  Procedure Laterality Date  . Cardiac catheterization    . Laparoscopic gastric banding  05/21/10   History reviewed. No pertinent family history. History  Substance Use Topics  . Smoking status: Never Smoker   . Smokeless tobacco: Current User    Types: Chew  . Alcohol Use: Yes    Review of Systems  Musculoskeletal: Positive for myalgias.  Skin: Negative for wound.  Neurological: Negative for numbness.   Allergies  Review of patient's allergies indicates no known allergies.  Home Medications   Prior to Admission medications   Medication Sig Start Date End Date Taking? Authorizing Provider  aspirin 325 MG tablet Take 325 mg by mouth daily.    Historical Provider, MD  atenolol (TENORMIN) 25 MG tablet Take 25 mg by mouth daily.    Historical Provider, MD  citalopram (CELEXA) 40 MG tablet daily.  09/15/11   Historical Provider, MD  HYDROcodone-acetaminophen (NORCO/VICODIN) 5-325 MG per tablet Take 2 tablets by mouth every 4 (four) hours as needed. 11/21/14   Vida RollerBrian D Alaisha Eversley, MD  isosorbide mononitrate (IMDUR) 30 MG 24 hr tablet Take 30 mg by mouth daily.  09/11/11   Historical Provider, MD  naproxen (NAPROSYN) 500 MG tablet Take 1 tablet (500 mg total) by mouth 2 (two) times daily with a meal. 11/21/14   Vida RollerBrian D Derran Sear, MD  simvastatin (ZOCOR) 40 MG tablet daily. 09/15/11   Historical Provider, MD  testosterone cypionate (DEPOTESTOTERONE CYPIONATE) 200 MG/ML injection Inject 200 mg into the muscle every 14 (fourteen) days.  07/25/11   Historical Provider, MD   BP 138/62 mmHg  Pulse 80  Temp(Src) 99.1 F (37.3 C) (Oral)  Resp 18  Ht 6' (1.829 m)  Wt 310 lb (140.615 kg)  BMI 42.03 kg/m2  SpO2 97% Physical Exam  Constitutional: He appears well-developed and well-nourished.  HENT:  Head: Normocephalic and atraumatic.  Eyes: Conjunctivae are normal. Right eye exhibits no discharge. Left eye exhibits no discharge.  Cardiovascular:  Normal pulses at radial arteries  Pulmonary/Chest: Effort normal.  Musculoskeletal: Normal range of motion. He exhibits tenderness. He exhibits no edema.  Preserved ability to flex against resistance of right upper, but has visual deficit to medial distal biceps consistent with rupture. No pain  in the elbow with ROM.   Neurological: He is alert. Coordination normal.  Normal strength; normal sensation of right arm.   Skin: Skin is warm and dry. No rash noted. He is not diaphoretic. No erythema.  Psychiatric: He has a normal mood and affect.  Nursing note and vitals reviewed.   ED Course  Procedures (including critical care time) DIAGNOSTIC STUDIES: Oxygen Saturation is 97% on RA, normal by my interpretation.    COORDINATION OF CARE: 7:46 PM Discussed treatment plan with pt which includes follow up with orthopedist. Pt agreed to plan.  Labs Review Labs  Reviewed - No data to display  Imaging Review No results found.    MDM   Final diagnoses:  Biceps rupture, distal, right, initial encounter    Pt has partial tear of medial belly / distal biceps, preserved ability to flex. I personally performed the services described in this documentation, which was scribed in my presence. The recorded information has been reviewed and is accurate.       Vida RollerBrian D Shyheim Tanney, MD 11/22/14 [phone]

## 2014-11-21 NOTE — ED Notes (Signed)
Pt fell and caught himself with his right arm. Pt c/o right arm pain and his bicep on the right side looks permanently flexed.

## 2014-11-21 NOTE — Discharge Instructions (Signed)
Biceps Tendon Disruption (Distal) with Rehab The biceps tendon attaches the biceps muscle to the bones of the elbow and the shoulder. A distal biceps tendon disruption is a tear of this tendon at the end the attached near the elbow. A distal biceps tendon rupture is an uncommon injury. These injuries usually involve a complete tear of the tendon from the bone; however, partial tears are also possible. The bicep muscle works with other muscles to bend the elbow and rotate the palm upward (supinate). A complete biceps rupture will result in approximately a 30% decrease in elbow bending strength and a 40% decrease in one's ability to supinate the wrist. SYMPTOMS   Pain, tenderness, swelling, warmth, or redness at the elbow, usually in the front of the elbow.  Pain that worsens with flexion of the elbow against resistance and when straightening the elbow.  Bulge can be seen and felt in the arm.  Bruising (contusion) in the elbow or forearm after 24 hours.  Limited motion of the elbow.  Weakness with attempted elbow bending (lifting or carrying) or rotation of the wrist (like when using a screwdriver).  A crackling sound (crepitation) when the tendon or elbow is moved or touched. CAUSES  A biceps tendon rupture occurs when the tendon is subjected to a force that is greater than it can withstand, such as straightening the elbow while the biceps is contracted or direct trauma (rare). RISK INCREASES WITH:   Sports that involve contact, as well as throwing sports, gymnastics, weightlifting, and bodybuilding.  Heavy labor.  Poor strength and flexibility.  Failure to warm-up properly before activity. PREVENTION  Warm up and stretch appropriately before activity.  Maintain physical fitness:  Strength, flexibility, and endurance.  Cardiovascular fitness  Allow your body to recover between practices and competition.  Learn and use proper technique. PROGNOSIS  Surgery is usually required  to fix distal biceps tendon rupture. After surgery, a recovery period of 4 to 8 months can be expected to allow for healing and a return to sports.  RELATED COMPLICATIONS  Weakness of elbow bending and forearm rotation, especially if treated non-surgically.  Prolonged disability.  Re-rupture of the tendon after surgery.  Risks of surgery, including infection, bleeding, injury to nerves, elbow or wrist stiffness or loss of motion, and weakness of elbow bending or wrist rotation. TREATMENT  Treatment initially consists of ice and medication to help reduce pain and inflammation. A sling may also be worn to increase one's comfort. Surgery is required for a full recovery and return to sports. Surgery involves reattaching the tendon to the bone. Weakness can be expected if surgery is not performed; however, this may be acceptable for sedentary individuals. Surgery is usually followed by immobilization and rehabilitation exercises to regain strength and a full range of motion.  MEDICATION  If pain medication is necessary, nonsteroidal anti-inflammatory medications, such as aspirin and ibuprofen, or other minor pain relievers, such as acetaminophen, are often recommended.  Do not take pain medication for 7 days before surgery.  Prescription pain relievers may be given if deemed necessary by your caregiver. Use only as directed and only as much as you need. HEAT AND COLD  Cold treatment (icing) relieves pain and reduces inflammation. Cold treatment should be applied for 10 to 15 minutes every 2 to 3 hours for inflammation and pain and immediately after any activity that aggravates your symptoms. Use ice packs or an ice massage.  Heat treatment may be used prior to performing the stretching and strengthening   activities prescribed by your caregiver, physical therapist, or athletic trainer. Use a heat pack or a warm soak. SEEK MEDICAL CARE IF:   Symptoms get worse or do not improve in 2 weeks despite  treatment.  You experience pain, numbness, or coldness in the hand.  Blue, gray, or dark color appears in the fingernails.  Any of the following occur after surgery:  Increased pain, swelling, redness, drainage, or bleeding in the surgical area.  Signs of infection (headache, muscle aches, dizziness, or a general ill feeling with fever).  New, unexplained symptoms develop (drugs used in treatment may produce side effects). EXERCISES RANGE OF MOTION (ROM) AND STRETCHING EXERCISES - Biceps Tendon Disruption (Distal) Once your physician, physical therapist or athletic trainer has permitted you to discontinue using your brace or splint, you may begin to restore your elbow motion by using these exercises. Beginning these exercises before your provider's approval may result in delayed healing. While completing these exercises, remember:   Restoring tissue flexibility helps normal motion to return to the joints. This allows healthier, less painful movement and activity.  An effective stretch should be held for at least 30 seconds.  A stretch should never be painful. You should only feel a gentle lengthening or release in the stretched tissue. RANGE OF MOTION - Extension  Hold your right / left arm at your side and straighten your elbow as far as you can using your right / left arm muscles.  Straighten the right / left elbow farther by gently pushing down on your forearm until you feel a gentle stretch on the inside of your elbow. Hold this position for __________ seconds.  Slowly return to the starting position. Repeat __________ times. Complete this exercise __________ times per day.  RANGE OF MOTION - Flexion  Hold your right / left arm at your side and bend your elbow as far as you can using your right / left arm muscles.  Bend the right / left elbow farther by gently pushing up on your forearm until you feel a gentle stretch on the outside of your elbow. Hold this position for  __________ seconds.  Slowly return to the starting position. Repeat __________ times. Complete this exercise __________ times per day.  RANGE OF MOTION - Supination, Active   Stand or sit with your elbows at your side. Bend your right / left elbow to 90 degrees.  Turn your palm upward until you feel a gentle stretch on the inside of your forearm.  Hold this position for __________ seconds. Slowly release and return to the starting position. Repeat __________ times. Complete this stretch __________ times per day.  RANGE OF MOTION - Pronation, Active   Stand or sit with your elbows at your side. Bend your right / left elbow to 90 degrees.  Turn your palm downward until you feel a gentle stretch on the top of your forearm.  Hold this position for __________ seconds. Slowly release and return to the starting position. Repeat __________ times. Complete this stretch __________ times per day.  STRENGTHENING EXERCISES - Biceps Tendon Disruption (Distal) Once your physician, physical therapist, or athletic trainer has permitted you to discontinue using your brace or splint, you may begin restoring your arm strength by using these exercises. Beginning these before your provider's approval may result in delayed healing. While completing these exercises, remember:   Muscles can gain both the endurance and the strength needed for everyday activities through controlled exercises.  Complete these exercises as instructed by your physician,   physical therapist, or athletic trainer. Progress the resistance and repetitions only as guided.  You may experience muscle soreness or fatigue, but the pain or discomfort you are trying to eliminate should never worsen during these exercises. If this pain does worsen, stop and make certain you are following the directions exactly. If the pain is still present after adjustments, discontinue the exercise until you can discuss the trouble with your clinician. STRENGTH -  Elbow Flexors, Isometric   Stand or sit upright on a firm surface. Place your right / left arm so that your hand is palm-up and at the height of your waist.  Place your opposite hand on top of your forearm. Gently push down as your right / left arm resists. Push as hard as you can with both arms without causing any pain or movement at your right / left elbow. Hold this stationary position for __________ seconds.  Gradually release the tension in both arms. Allow your muscles to relax completely before repeating. Repeat __________ times. Complete this exercise __________ times per day. STRENGTH - Elbow Extensors, Isometric   Stand or sit upright on a firm surface. Place your right / left arm so that your palm faces your abdomen and it is at the height of your waist.  Place your opposite hand on the underside of your forearm. Gently push up as your right / left arm resists. Push as hard as you can with both arms without causing any pain or movement at your right / left elbow. Hold this stationary position for __________ seconds.  Gradually release the tension in both arms. Allow your muscles to relax completely before repeating. Repeat __________ times. Complete this exercise __________ times per day. STRENGTH - Elbow Flexors, Supinated  With good posture, stand or sit on a firm chair without armrests. Allow your right / left arm to rest at your side with your palm facing forward.  Holding a __________ weight or gripping a rubber exercise band/tubing, bring your right / left hand toward your shoulder.  Allow your muscles to control the resistance as your hand returns to your side. Repeat __________ times. Complete this exercise __________ times per day.  STRENGTH - Elbow Flexors, Neutral  With good posture, stand or sit on a firm chair without armrests. Allow your right / left arm to rest at your side with your thumb facing forward.  Holding a __________ weight or gripping a rubber exercise  band/tubing, bring your right / left hand toward your shoulder.  Allow your muscles to control the resistance as your hand returns to your side. Repeat __________ times. Complete this exercise __________ times per day.  STRENGTH - Elbow Extensors  Lie on your back. Extend your right / left elbow into the air, pointing it toward the ceiling. Brace your arm with your opposite hand.*  Holding a __________ weight in your hand, slowly straighten your right / left elbow.  Allow your muscles to control the weight as your hand returns to its starting position. Repeat __________ times. Complete this exercise __________ times per day. *You may also stand with your elbow overhead and pointed toward the ceiling and supported by your opposite hand. STRENGTH - Elbow Extensors, Dynamic  With good posture, stand or sit on a firm chair without armrests. Keeping your upper arms at your side, bring both hands up to your right / left shoulder while gripping a rubber exercise band/tubing. Your right / left hand should be just below the other hand.  Straighten your   right / left elbow. Hold for __________ seconds.  Allow your muscles to control the rubber exercise band/tubing as your hand returns to your shoulder. Repeat __________ times. Complete this exercise __________ times per day. STRENGTH - Forearm Supinators   Sit with your right / left forearm supported on a table, keeping your elbow below shoulder height. Rest your hand over the edge, palm down.  Gently grip a hammer or a soup ladle.  Without moving your elbow, slowly turn your palm and hand upward to a "thumbs-up" position.  Hold this position for __________ seconds. Slowly return to the starting position. Repeat __________ times. Complete this exercise __________ times per day.  STRENGTH - Forearm Pronators   Sit with your right / left forearm supported on a table, keeping your elbow below shoulder height. Rest your hand over the edge, palm  up.  Gently grip a hammer or a soup ladle.  Without moving your elbow, slowly turn your palm and hand upward to a "thumbs-up" position.  Hold this position for __________ seconds. Slowly return to the starting position. Repeat __________ times. Complete this exercise __________ times per day.  Document Released: 12/01/2005 Document Revised: 02/23/2012 Document Reviewed: 03/15/2009 [employer] Patient Information 2015 [employer]. This information is not intended to replace advice given to you by your health care provider. Make sure you discuss any questions you have with your health care provider.  

## 2017-12-24 ENCOUNTER — Encounter (HOSPITAL_COMMUNITY): Payer: Self-pay

## 2018-12-13 ENCOUNTER — Encounter (HOSPITAL_COMMUNITY): Payer: Self-pay
